# Patient Record
Sex: Female | Born: 1995 | Race: Black or African American | Hispanic: No | Marital: Single | Smoking: Never smoker
Health system: Southern US, Community
[De-identification: ages and names within clinical notes are randomized; demographics above are authoritative.]

## PROBLEM LIST (undated history)

## (undated) DIAGNOSIS — J45909 Unspecified asthma, uncomplicated: Secondary | ICD-10-CM

---

## 2014-05-29 ENCOUNTER — Encounter (HOSPITAL_COMMUNITY): Payer: Self-pay | Admitting: Emergency Medicine

## 2014-05-29 ENCOUNTER — Emergency Department (HOSPITAL_COMMUNITY): Payer: BLUE CROSS/BLUE SHIELD

## 2014-05-29 ENCOUNTER — Emergency Department (HOSPITAL_COMMUNITY)
Admission: EM | Admit: 2014-05-29 | Discharge: 2014-05-29 | Disposition: A | Discharge status: Home / Self Care | Payer: BLUE CROSS/BLUE SHIELD | Attending: Emergency Medicine | Admitting: Emergency Medicine

## 2014-05-29 DIAGNOSIS — W1839XA Other fall on same level, initial encounter: Secondary | ICD-10-CM | POA: Diagnosis not present

## 2014-05-29 DIAGNOSIS — S8392XA Sprain of unspecified site of left knee, initial encounter: Secondary | ICD-10-CM | POA: Insufficient documentation

## 2014-05-29 DIAGNOSIS — Y9367 Activity, basketball: Secondary | ICD-10-CM | POA: Diagnosis not present

## 2014-05-29 DIAGNOSIS — Y92310 Basketball court as the place of occurrence of the external cause: Secondary | ICD-10-CM | POA: Insufficient documentation

## 2014-05-29 DIAGNOSIS — R52 Pain, unspecified: Secondary | ICD-10-CM

## 2014-05-29 DIAGNOSIS — Y998 Other external cause status: Secondary | ICD-10-CM | POA: Insufficient documentation

## 2014-05-29 DIAGNOSIS — J45909 Unspecified asthma, uncomplicated: Secondary | ICD-10-CM | POA: Diagnosis not present

## 2014-05-29 DIAGNOSIS — S63502A Unspecified sprain of left wrist, initial encounter: Secondary | ICD-10-CM | POA: Insufficient documentation

## 2014-05-29 DIAGNOSIS — S6992XA Unspecified injury of left wrist, hand and finger(s), initial encounter: Secondary | ICD-10-CM | POA: Diagnosis present

## 2014-05-29 HISTORY — DX: Unspecified asthma, uncomplicated: J45.909

## 2014-05-29 MED ORDER — HYDROCODONE-ACETAMINOPHEN 5-325 MG PO TABS
1.0000 | ORAL_TABLET | Freq: Once | ORAL | Status: AC
  Administered 2014-05-29: 1 via ORAL
  Filled 2014-05-29: qty 1

## 2014-05-29 MED ORDER — HYDROCODONE-ACETAMINOPHEN 5-325 MG PO TABS: ORAL_TABLET | ORAL | Status: AC

## 2014-05-29 NOTE — ED Notes (Signed)
Pt complaint of left wrist and left knee pain post fall playing basketball. No bruising or deformity noted. Pt ambulatory to room from triage.

## 2014-05-29 NOTE — ED Notes (Signed)
Ortho technician at bedside. 

## 2014-05-29 NOTE — ED Notes (Signed)
Mother at bedside. Pisciotta aware and reports will be at bedside shortly.

## 2014-05-29 NOTE — ED Notes (Signed)
Pt c/o left knee and left wrist pain onset last night, after getting knocked to the ground during basketball, pt states her left knee twisted and she felt a "pop," pt caught self with left wrist on ground.

## 2014-05-29 NOTE — ED Notes (Signed)
Pt transported to DG.  

## 2014-05-29 NOTE — Discharge Instructions (Signed)
You were diagnosed with a right wrist sprain, a fracture of the scaphoid bone cannot be ruled out at this time. For this reason, please wear your splint at all times,  cover with a plastic bag when you bathe. You will need a repeat x-ray in 7-10 days to be sure this bone is not broken. Please follow with your primary doctor, or the orthopedist you have been referred to, or, you may return to the emergency room for the repeat x-ray.   Take 800mg  of ibuprofen (that is usually 4 over the counter pills)  3 times a day for pain control. Take with food. Take vicodin for breakthrough pain, do not drink alcohol, drive, care for children or do other critical tasks while taking vicodin.  Please follow with your primary care doctor in the next 2 days for a check-up. They must obtain records for further management.   Do not hesitate to return to the Emergency Department for any new, worsening or concerning symptoms.

## 2014-05-29 NOTE — ED Provider Notes (Signed)
CSN: [id number]     Arrival date & time 05/29/14  0756 History   First MD Initiated Contact with Patient 05/29/14 0802     Chief Complaint  Patient presents with  . Knee Pain  . Wrist Pain     (Consider location/radiation/quality/duration/timing/severity/associated sxs/prior Treatment) HPI  Lisa Roberson is a 19 y.o. female complaining of 8 out of 10 left wrist and left knee pain. Patient states that she was playing basketball last night, she was filed and she fell down. She can't explain exactly how she fell. She is taking no pain medication prior to arrival, she did apply ice. She has been ambulatory. She is left-hand dominant. She denies numbness, weakness, states that the pain is made worse by weightbearing and palpation. Patient states that she felt a pop in the knee.  Past Medical History  Diagnosis Date  . Asthma    History reviewed. No pertinent past surgical history. No family history on file. History  Substance Use Topics  . Smoking status: Never Smoker   . Smokeless tobacco: Not on file  . Alcohol Use: No   OB History    No data available     Review of Systems  10 systems reviewed and found to be negative, except as noted in the HPI.   Allergies  Review of patient's allergies indicates no known allergies.  Home Medications   Prior to Admission medications   Medication Sig Start Date End Date Taking? Authorizing Provider  albuterol (PROVENTIL HFA;VENTOLIN HFA) 108 (90 BASE) MCG/ACT inhaler Inhale 2 puffs into the lungs every 4 (four) hours as needed. Shortness of breath and wheezing 10/05/12  Yes Historical Provider, MD  HYDROcodone-acetaminophen (NORCO/VICODIN) 5-325 MG per tablet Take 1-2 tablets by mouth every 6 hours as needed for pain and/or cough. 05/29/14   Neftaly Swiss, PA-C   BP 94/57 mmHg  Pulse 54  Temp(Src) 98.3 F (36.8 C) (Oral)  Resp 16  SpO2 98%  LMP 05/17/2014 Physical Exam  Constitutional: She is oriented to person, place, and  time. She appears well-developed and well-nourished. No distress.  HENT:  Head: Normocephalic.  Eyes: Conjunctivae and EOM are normal.  Cardiovascular: Normal rate.   Pulmonary/Chest: Effort normal. No stridor.  Musculoskeletal: Normal range of motion. She exhibits tenderness. She exhibits no edema.  Left wrist with no overlying skin changes, no deformity, radial pulses 2+, full range of motion to fingers. She is neurovascularly intact. Patient has mild swelling and tenderness to palpation along the snuff box. No tenderness palpation along the elbow, full range of motion.  Left knee:  No deformity, erythema or abrasions. FROM. No effusion or crepitance. Anterior and posterior drawer show no abnormal laxity. Stable to valgus and varus stress. Joint lines are non-tender. Neurovascularly intact. Pt ambulates with slightly antalgic gait.    Neurological: She is alert and oriented to person, place, and time.  Psychiatric: She has a normal mood and affect.  Nursing note and vitals reviewed.   ED Course  Procedures (including critical care time) Labs Review Labs Reviewed - No data to display  Imaging Review Dg Wrist Complete Left  05/29/2014   CLINICAL DATA:  Wrist pain.  Initial encounter.  Basketball injury.  EXAM: LEFT WRIST - COMPLETE 3+ VIEW  COMPARISON:  None.  FINDINGS: There is no evidence of fracture or dislocation. There is no evidence of arthropathy or other focal bone abnormality. Soft tissues are unremarkable.  IMPRESSION: Negative.   Electronically Signed   By: Charolette ChildGeoffrey  Lamke M.D.  On: 05/29/2014 08:46   Dg Knee Complete 4 Views Left  05/29/2014   CLINICAL DATA:  19 year old female who fell during basketball, with twisting injury. Pain. Initial encounter.  EXAM: LEFT KNEE - COMPLETE 4+ VIEW  COMPARISON:  None.  FINDINGS: Borderline to mild medial compartment joint space loss. No definite joint effusion. Bone mineralization is within normal limits. Patella intact. No acute osseous  abnormality identified.  IMPRESSION: No acute osseous abnormality identified at the left knee.   Electronically Signed   By: Odessa Fleming M.D.   On: 05/29/2014 08:46     EKG Interpretation None      MDM   Final diagnoses:  Pain  Left wrist sprain, initial encounter  Left knee sprain, initial encounter    Filed Vitals:   05/29/14 0804 05/29/14 0813 05/29/14 0904  BP: 124/75  94/57  Pulse: 70  54  Temp:  98.3 F (36.8 C)   TempSrc:  Oral   Resp: 16  16  SpO2: 100%  98%    Medications  HYDROcodone-acetaminophen (NORCO/VICODIN) 5-325 MG per tablet 1 tablet (1 tablet Oral Given 05/29/14 0813)    Lisa Roberson is a pleasant 19 y.o. female presenting with knee and elbow pain after falling while playing basketball last night. Patient is neurovascularly intact. No gross ligamentous injury to the knee. Left wrist which is her dominant wrist, snuffbox tenderness. Will immobilize, obtain plain films. Patient will be given Vicodin, she states that her mother is coming to the ED. States she will not drive home.   X-rays negative, advised patient she will need a repeat wrist x-ray in 7-10 days. I-STAT follow with either Orthos or PCP.   Evaluation does not show pathology that would require ongoing emergent intervention or inpatient treatment. Pt is hemodynamically stable and mentating appropriately. Discussed findings and plan with patient/guardian, who agrees with care plan. All questions answered. Return precautions discussed and outpatient follow up given.   New Prescriptions   HYDROCODONE-ACETAMINOPHEN (NORCO/VICODIN) 5-325 MG PER TABLET    Take 1-2 tablets by mouth every 6 hours as needed for pain and/or cough.         Wynetta Emery, PA-C 05/29/14 0935  I discussed this case with the patient's mother who requests that an MRI be performed with an orthopedic doctor come to the ED to evaluate her colon have explained that as there is no emergency requiring immediate  intervention I cannot do that. All questions encouraged and answered to the best of my ability, orthopedic outpatient care given.  Wynetta Emery, PA-C 05/29/14 1018  Bethann Berkshire, MD 05/29/14 1530

## 2014-05-29 NOTE — ED Notes (Signed)
Pt request to hold discharge until mother arrives in 15 minutes. Per pt mother request to speak with attending prior to discharge. Pisciotta aware and states will be at bedside with mother arrival.

## 2014-05-29 NOTE — ED Notes (Signed)
Pisciotta at bedside. 

## 2014-06-03 ENCOUNTER — Other Ambulatory Visit: Payer: Self-pay | Admitting: Orthopaedic Surgery

## 2014-06-03 DIAGNOSIS — M25532 Pain in left wrist: Secondary | ICD-10-CM

## 2014-06-04 ENCOUNTER — Ambulatory Visit
Admission: RE | Admit: 2014-06-04 | Discharge: 2014-06-04 | Disposition: A | Discharge status: Home / Self Care | Payer: BLUE CROSS/BLUE SHIELD | Source: Ambulatory Visit | Attending: Orthopaedic Surgery | Admitting: Orthopaedic Surgery

## 2014-06-04 DIAGNOSIS — M25532 Pain in left wrist: Secondary | ICD-10-CM

## 2014-06-12 ENCOUNTER — Other Ambulatory Visit: Payer: Self-pay | Admitting: Orthopaedic Surgery

## 2014-06-12 DIAGNOSIS — M25562 Pain in left knee: Secondary | ICD-10-CM

## 2014-06-13 ENCOUNTER — Ambulatory Visit
Admission: RE | Admit: 2014-06-13 | Discharge: 2014-06-13 | Disposition: A | Discharge status: Home / Self Care | Payer: BLUE CROSS/BLUE SHIELD | Source: Ambulatory Visit | Attending: Orthopaedic Surgery | Admitting: Orthopaedic Surgery

## 2014-06-13 DIAGNOSIS — M25562 Pain in left knee: Secondary | ICD-10-CM

## 2014-07-04 ENCOUNTER — Ambulatory Visit: Payer: BLUE CROSS/BLUE SHIELD | Attending: Orthopedic Surgery | Admitting: Physical Therapy

## 2014-07-04 DIAGNOSIS — M25662 Stiffness of left knee, not elsewhere classified: Secondary | ICD-10-CM | POA: Diagnosis not present

## 2014-07-04 DIAGNOSIS — S83512D Sprain of anterior cruciate ligament of left knee, subsequent encounter: Secondary | ICD-10-CM | POA: Insufficient documentation

## 2014-07-04 DIAGNOSIS — X58XXXD Exposure to other specified factors, subsequent encounter: Secondary | ICD-10-CM | POA: Diagnosis not present

## 2014-07-04 DIAGNOSIS — M6281 Muscle weakness (generalized): Secondary | ICD-10-CM | POA: Insufficient documentation

## 2014-07-04 DIAGNOSIS — S83512A Sprain of anterior cruciate ligament of left knee, initial encounter: Secondary | ICD-10-CM

## 2014-07-04 NOTE — Patient Instructions (Signed)
   Hamstring Stretch   With other leg bent, foot flat, grasp right leg and slowly try to straighten knee. Hold __20__ seconds. Repeat ___3_ times. Do _2___ sessions per day.  http://gt2.exer.us/279   Hamstring Stretch (Standing)   Standing, place one heel on chair or bench. Use one or both hands on thigh for support. Keeping torso straight, lean forward slowly until a stretch is felt in back of same thigh. Hold ____ seconds. Repeat with other leg.  Hamstring Step 2   Left foot relaxed, knee straight, other leg bent, foot flat. Raise straight leg further upward to maximal range. Hold ___ seconds. Relax leg completely down. Repeat ___ times.  Hamstring Step 3   Left leg in maximal straight leg raise, heel at maximal stretch, straighten knee further by tightening knee cap. Warning: Intense stretch. Stay within tolerance. Hold ___ seconds. Relax knee cap only. Repeat ___ times.  Hamstring Step 4   Left leg and foot in maximal stretch. Slowly lengthen and press other leg down as close to floor as possible. Keep lower abdominals tight. Warning: Intense stretch. Stay within tolerance. Hold ___20 seconds. Relax lengthened leg slightly. Do not re-bend knee. Repeat press and lengthen _3__ times.   Stretching: Hamstring (Sitting)   With right leg straight, tuck other foot near groin. Reach down until stretch is felt in back of thigh. Keep back straight. Hold 20____ seconds. Repeat __3__ times per set. Do ____ sets per session. Do __3__ sessions per day.  http://orth.exer.us/661   Copyright  VHI. All rights reserved.    Copyright  VHI. All rights reserved.  HIP: Flexion / KNEE: Extension, Straight Leg Raise   Raise leg, keeping knee straight. Perform slowly. _20__ reps per set, _3__ sets per day, 7___ days per week   Copyright  VHI. All rights reserved.  Heel Slide   Bend knee and pull heel toward buttocks. Hold ___5_ seconds. Return. Repeat with other knee. Repeat  __10__ times. Do _6___ sessions per day.  http://gt2.exer.us/372   Copyright  VHI. All rights reserved.  Quad Set   Slowly tighten muscles on thigh of straight leg while counting out loud to _5___. Repeat with other leg. Repeat _10___ times. Do __6__ sessions per day.  http://gt2.exer.us/361   Copyright  VHI. All rights reserved.

## 2014-07-04 NOTE — Therapy (Signed)
[facility] [address] [place], [place], [address] Phone: [phone]   Fax:  [phone]  Physical Therapy Evaluation  Patient Details  Name: Lisa CustardCardesheia Roberson MRN: [id number] Date of Birth: 10-10-1995 Referring Provider:  Jetty PeeksPetrarca, Brian D, PA-C  Encounter Date: 07/04/2014      PT End of Session - 07/04/14 1322    Visit Number 1   Number of Visits 16   Date for PT Re-Evaluation 08/29/14   Authorization Type BCBS   PT Start Time 0800   PT Stop Time 0843   PT Time Calculation (min) 43 min   Activity Tolerance Patient tolerated treatment well      Past Medical History  Diagnosis Date  . Asthma     No past surgical history on file.  There were no vitals filed for this visit.  Visit Diagnosis:  ACL tear, left, initial encounter - Plan: PT plan of care cert/re-cert  Joint stiffness of knee, left - Plan: PT plan of care cert/re-cert  Muscle weakness of lower extremity - Plan: PT plan of care cert/re-cert      Subjective Assessment - 07/04/14 0804    Subjective Playing basketball and was fouled and fell in early April ( 1 1/2 months ago) resulting in partial ACL tear left and left hairline fracture (wearing a cast).  No previous knee problems.  Patient states the doctor wanted her to strengthen muscles before surgery.     Limitations Walking  running and balance on it   How long can you sit comfortably? no problem except sitting [place] style   How long can you stand comfortably? not much difficulty   How long can you walk comfortably? 1/2 mile   Diagnostic tests x-ray and MRI   Patient Stated Goals be back to normal so I can play basketball   Currently in Pain? Yes   Pain Score 0-No pain   Pain Location Knee   Pain Orientation Left   Pain Type Acute pain   Pain Onset More than a month ago   Pain Frequency Intermittent   Aggravating Factors  balancing on left leg, walking too long   Pain Relieving Factors not  doing anything            [facility] - 07/04/14 0810    Assessment   Medical Diagnosis left partial ACL tear   Onset Date 05/28/14   Next MD Visit 07/05/14   Prior Therapy no   Precautions   Precautions None   Restrictions   Weight Bearing Restrictions No   Other Position/Activity Restrictions --  used crutches 1-2 days    Balance Screen   Has the patient fallen in the past 6 months Yes  1   How many times? 1   Has the patient had a decrease in activity level because of a fear of falling?  No   Is the patient reluctant to leave their home because of a fear of falling?  No   Home Environment   Living Enviornment Private residence   Living Arrangements Non-relatives/Friends   Type of Home Apartment   Home Access Stairs to enter   Entrance Stairs-Number of Steps 3 flights   Entrance Stairs-Rails Left   Home Layout One level   Prior Function   Level of Independence Independent with basic ADLs   Vocation Student  starting part-time job next week sitting?   Leisure play intramural basketball   Observation/Other Assessments   Observations no swelling present at this  early AM appt.   Focus on Therapeutic Outcomes (FOTO)  47%   Functional Tests   Functional tests Step down   Step Down   Comments pelvic drop and genu valgus left > right   ROM / Strength   AROM / PROM / Strength AROM;Strength   AROM   Overall AROM Comments good patellar mobility, no pain   AROM Assessment Site Knee   Right/Left Knee Right;Left   Right Knee Extension 0   Right Knee Flexion 140   Left Knee Extension 8   Left Knee Flexion 132   Strength   Overall Strength Comments Left quad lag with SLR   Strength Assessment Site Knee;Hip   Right/Left Hip Right;Left   Right Hip Flexion 5/5   Right Hip Extension 5/5   Right Hip ABduction 5/5   Left Hip Flexion 5/5   Left Hip Extension 5/5   Left Hip ABduction 4+/5   Right/Left Knee Right;Left   Right Knee Flexion 5/5   Right Knee Extension  5/5   Left Knee Flexion 4/5   Left Knee Extension 4/5   Flexibility   Soft Tissue Assessment /Muscle Length --  Bilateral HS length 80 degrees   Special Tests    Special Tests Knee Special Tests   Knee Special tests  other   other    Findings --  single leg stand with mild compensation/dec balance   Ambulation/Gait   Gait Comments No limp with walking at normal gait speed on level surface, no assistive device                   [facility] Adult PT Treatment/Exercise - 07/04/14 0810    Knee/Hip Exercises: Aerobic   Stationary Bike 8 min full revolutions                PT Education - 07/04/14 1321    Education provided Yes   Education Details Initial HEP:  HS stretch, SLR, quad sets, heel slides   Person(s) Educated Patient   Methods Explanation;Demonstration;Handout   Comprehension Verbalized understanding;Returned demonstration          PT Short Term Goals - 07/04/14 1326    PT SHORT TERM GOAL #1   Title Patient will express a good understanding of basic exercises to increase ROM, home methods for swelling and pain control   Time 4   Period Weeks   Status New   PT SHORT TERM GOAL #2   Title Left knee AROM 4-135 degrees needed for greater ease on the stairs to enter/exit apartment.   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  She is referred to PT for strengthening of her left knee.  She presents ambulating without an assisitive device, no bracing.  She reports decreasing pain which when present is in the posterior aspect of her left knee.   She reports difficulty walking longer distances, difficulty going up and down 3 flights of stairs to get to her apartment and the inability to play basketball.  Her AROM is limited:  8-132 degrees (0-140 on right).  +Quad lag with SLR.  Decreased left quad, HS and gluteus medius strength 4/5.  Compensatory pelvic drop and genu valgus with single leg standing and step down tests.  Patient would benefit from PT to address these deficits.     Pt will benefit from skilled therapeutic intervention in order to improve on the following deficits Decreased range of motion;Decreased strength;Pain   Rehab Potential Good   Clinical Impairments Affecting Rehab Potential Partial left ACL tear   PT Frequency 2x / week   PT Duration 8 weeks   PT Treatment/Interventions Cryotherapy;Electrical Stimulation;Moist Heat;Ultrasound;Therapeutic exercise;Neuromuscular re-education;Patient/family education;Manual techniques   PT Next Visit Plan Bike; glut med strengthening; low to moderate level closed chain strengthening;  cold pack if needed         Problem List There are no active problems to display for this patient.   Vivien Presto 07/04/2014, 4:30 PM  [facility] [address] [place], [place], [address] Phone: [phone]   Fax:  [phone]   Lavinia Sharps, PT 07/04/2014 4:31 PM Phone: [phone] Fax: [phone]

## 2014-07-09 ENCOUNTER — Ambulatory Visit: Payer: BLUE CROSS/BLUE SHIELD | Admitting: Physical Therapy

## 2014-07-09 DIAGNOSIS — S83512A Sprain of anterior cruciate ligament of left knee, initial encounter: Secondary | ICD-10-CM

## 2014-07-09 DIAGNOSIS — M6281 Muscle weakness (generalized): Secondary | ICD-10-CM

## 2014-07-09 DIAGNOSIS — S83512D Sprain of anterior cruciate ligament of left knee, subsequent encounter: Secondary | ICD-10-CM | POA: Diagnosis not present

## 2014-07-09 DIAGNOSIS — M25662 Stiffness of left knee, not elsewhere classified: Secondary | ICD-10-CM

## 2014-07-09 NOTE — Therapy (Signed)
[place], [place], [address] Phone: [phone]   Fax:  [phone]  Physical Therapy Treatment  Patient Details  Name: Lisa Roberson MRN: [id number] Date of Birth: [birth year] Referring Provider:  Cherylann Ratel, PA-C  Encounter Date: 07/09/2014      PT End of Session - 07/09/14 0815    Visit Number 2   Number of Visits 16   Date for PT Re-Evaluation 08/29/14   Authorization Type BCBS   PT Start Time 0800   PT Stop Time 0848   PT Time Calculation (min) 48 min   Activity Tolerance Patient tolerated treatment well      Past Medical History  Diagnosis Date  . Asthma     No past surgical history on file.  There were no vitals filed for this visit.  Visit Diagnosis:  ACL tear, left, initial encounter  Joint stiffness of knee, left  Muscle weakness of lower extremity      Subjective Assessment - 07/09/14 0801    Subjective Reports minor soreness after last visit but nothing too bad.  Denies pain today.  Got cast off her left forearm.     Currently in Pain? No/denies   Aggravating Factors  walking too long; balancing on left leg   Pain Relieving Factors not doing anything                         [facility] Adult PT Treatment/Exercise - 07/09/14 0803    Knee/Hip Exercises: Stretches   Passive Hamstring Stretch 3 reps;20 seconds   Quad Stretch 3 reps;20 seconds   Knee/Hip Exercises: Aerobic   Stationary Bike 8 min full revolutions   Knee/Hip Exercises: Standing   Heel Raises 20 reps   Knee Flexion Left;AROM;10 reps   Lateral Step Up Left;1 set;10 reps;Step Height: 4";Hand Hold: 0   Forward Step Up Left;1 set;10 reps;Step Height: 4";Hand Hold: 0   Step Down Left;10 reps;Step Height: 4";Hand Hold: 1   SLS with Vectors 10x WB 4 ways on left   Other Standing Knee Exercises mini squat 15x   Knee/Hip Exercises: Supine   Quad Sets Strengthening;Both;1 set;10 reps   Heel  Slides AROM;1 set;10 reps;Left   Hip Adduction Isometric Strengthening;Both;1 set;10 reps   Straight Leg Raises Strengthening;Left;1 set   Knee/Hip Exercises: Sidelying   Hip ABduction AROM;Left;1 set;10 reps   Hip ADduction AROM;Left;1 set;10 reps   Clams 10x left   Knee/Hip Exercises: Machines for Strengthening   Cybex Knee Flexion 30# B and 30# with left only 15x each   Total Gym Leg Press B 40# 15x; Left only 20# 15x                  PT Short Term Goals - 07/09/14 0840    PT SHORT TERM GOAL #1   Title Patient will express a good understanding of basic exercises to increase ROM, home methods for swelling and pain control   Time 4   Period Weeks   Status Partially Met   PT SHORT TERM GOAL #2   Title Left knee AROM 4-135 degrees needed for greater ease on the stairs to enter/exit apartment.   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     Time 8   Period Weeks   Status On-going   PT LONG TERM GOAL #4   Title FOTO functional outcome score improved 47% to 26% indicating improved function with less pain   Time 8   Period Weeks   Status On-going               Plan - 07/09/14 4514    Clinical Impression Statement Patient with improved quad set and decreased lag sign with SLR.  Therapist cueing for patellofemoral alignment.  No complaints of pain.  Quad muscle atrophy apparent on left.   Visible muscle quivering with single activities.     Clinical Impairments Affecting Rehab Potential Partial left ACL tear   PT Next Visit Plan Progress to intermediate exercises with focus on closed chain quad and gluteal strengthening;  remeasure left knee AROM        Problem List There are no active problems to display for this patient.   [name] 07/09/2014, 8:42 AM  [facility] [address] [place], [place], [address] Phone: [phone]   Fax:  [phone]   Ruben Im, PT 07/09/2014 8:42 AM Phone: [phone] Fax: [phone]

## 2014-07-12 ENCOUNTER — Ambulatory Visit: Payer: BLUE CROSS/BLUE SHIELD | Admitting: Physical Therapy

## 2014-07-12 DIAGNOSIS — S83512A Sprain of anterior cruciate ligament of left knee, initial encounter: Secondary | ICD-10-CM

## 2014-07-12 DIAGNOSIS — M6281 Muscle weakness (generalized): Secondary | ICD-10-CM

## 2014-07-12 DIAGNOSIS — M25662 Stiffness of left knee, not elsewhere classified: Secondary | ICD-10-CM

## 2014-07-12 DIAGNOSIS — S83512D Sprain of anterior cruciate ligament of left knee, subsequent encounter: Secondary | ICD-10-CM | POA: Diagnosis not present

## 2014-07-12 NOTE — Therapy (Signed)
[facility] Outpatient Rehabilitation [facility] [address] [place], [place], [address] Phone: [phone]   Fax:  [phone]  Physical Therapy Treatment  Patient Details  Name: Lisa Roberson MRN: [id number] Date of Birth: 07/28/1995 Referring Provider:  Jetty Peeks, PA-C  Encounter Date: 07/12/2014      PT End of Session - 07/12/14 0727    Visit Number 3   Number of Visits 16   Date for PT Re-Evaluation 08/29/14   Authorization Type BCBS   PT Start Time 0657   PT Stop Time 0738   PT Time Calculation (min) 41 min   Activity Tolerance Patient tolerated treatment well      Past Medical History  Diagnosis Date  . Asthma     No past surgical history on file.  There were no vitals filed for this visit.  Visit Diagnosis:  ACL tear, left, initial encounter  Joint stiffness of knee, left  Muscle weakness of lower extremity      Subjective Assessment - 07/12/14 0658    Subjective Reports some soreness that next day after last session but nothing too bad.  Ambulating on level surface without limp.  Wearing left wrist brace.  Going home to [place] for cousin's graduation.    Currently in Pain? No/denies   Pain Orientation Left   Pain Type Acute pain   Pain Onset More than a month ago   Pain Frequency Intermittent   Aggravating Factors  prolonged walking; standing a lot   Pain Relieving Factors not doing anything            [facility] PT Assessment - 07/12/14 0704    ROM / Strength   AROM / PROM / Strength AROM   AROM   Left Knee Extension 0   Left Knee Flexion 138                     [facility] Adult PT Treatment/Exercise - 07/12/14 0702    Knee/Hip Exercises: Aerobic   Stationary Bike 5 min   Elliptical 5 min   Knee/Hip Exercises: Machines for Strengthening   Total Gym Leg Press B 40# 15x; Left only 20# 15x2   Knee/Hip Exercises: Standing   Lateral Step Up 1 set;Left;15 reps;Hand Hold: 0;Step Height: 6"   Forward Step Up  Left;1 set;15 reps;Hand Hold: 0;Step Height: 6"   Wall Squat 1 set;15 reps  with physioball   SLS on left with right UE green band diagonal extensions 2x 15   SLS with Vectors WB on left right green band 15x 4 ways   Knee/Hip Exercises: Supine   Bridges Right;Left;Both;2 sets;15 reps   Other Supine Knee Exercises HS sets on ball 15x; HS sets with curls on ball 15x   Knee/Hip Exercises: Sidelying   Other Sidelying Knee Exercises side planks 2x 30 sec right and left   Knee/Hip Exercises: Prone   Other Prone Exercises planks 1 x 30 secs                  PT Short Term Goals - 07/12/14 6962    PT SHORT TERM GOAL #1   Title Patient will express a good understanding of basic exercises to increase ROM, home methods for swelling and pain control   Status Achieved   PT SHORT TERM GOAL #2   Title Left knee AROM 4-135 degrees needed for greater ease on the stairs to enter/exit apartment.   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     Time 8   Period Weeks   Status On-going   PT LONG TERM GOAL #4   Title FOTO functional outcome score improved 47% to 26% indicating improved function with less pain   Time 8   Period Weeks   Status On-going               Plan - 07/12/14 16100728    Clinical Impression Statement Good improvement in knee AROM.  Improving proprioception.  Min verbal cues to avoid knee hyperextension and for patellofemoral alignment.   Tightness reported but no pain following treatment session.  Progressing with goals.    PT Next Visit Plan Progress to intermediate exercises with focus on closed chain quad, core and gluteal strengthening   Recommended Other Services MD follow up 6/24        Problem List There are no active problems to display for this patient.   Vivien PrestoSimpson, Stacy C 07/12/2014, 7:42 AM  [facility] [address] [place], [place], [address] Phone: [phone]   Fax:  [phone]    Lavinia SharpsStacy Simpson, PT 07/12/2014 7:43 AM Phone: [phone] Fax: [phone]

## 2014-07-23 ENCOUNTER — Ambulatory Visit: Payer: BLUE CROSS/BLUE SHIELD | Attending: Orthopedic Surgery | Admitting: Physical Therapy

## 2014-07-23 DIAGNOSIS — M25662 Stiffness of left knee, not elsewhere classified: Secondary | ICD-10-CM | POA: Diagnosis present

## 2014-07-23 DIAGNOSIS — X58XXXA Exposure to other specified factors, initial encounter: Secondary | ICD-10-CM | POA: Insufficient documentation

## 2014-07-23 DIAGNOSIS — M6281 Muscle weakness (generalized): Secondary | ICD-10-CM

## 2014-07-23 DIAGNOSIS — S83512A Sprain of anterior cruciate ligament of left knee, initial encounter: Secondary | ICD-10-CM | POA: Insufficient documentation

## 2014-07-23 NOTE — Therapy (Signed)
[facility] Outpatient Rehabilitation [facility] [address] [place], [place], [address] Phone: [phone]   Fax:  [phone]  Physical Therapy Treatment  Patient Details  Name: Lisa Roberson MRN: [id number] Date of Birth: 1995/03/11 Referring Provider:  Jetty Peeks, PA-C  Encounter Date: 07/23/2014      PT End of Session - 07/23/14 0837    Visit Number 4   Number of Visits 16   Date for PT Re-Evaluation 08/29/14   Authorization Type BCBS   PT Start Time 0800   PT Stop Time 0845   PT Time Calculation (min) 45 min   Activity Tolerance Patient tolerated treatment well      Past Medical History  Diagnosis Date  . Asthma     No past surgical history on file.  There were no vitals filed for this visit.  Visit Diagnosis:  ACL tear, left, initial encounter  Joint stiffness of knee, left  Muscle weakness of lower extremity      Subjective Assessment - 07/23/14 0804    Subjective It's been sore.  States she is going to the gym and doing the bike and leg press.  States she feels her knee is getting stronger.  When she does a full knee bend with self overpressure it hurts.     Currently in Pain? No/denies  states "just sore."   Pain Score 0-No pain   Pain Location Knee   Pain Orientation Left   Pain Onset More than a month ago   Pain Frequency Intermittent   Aggravating Factors  bend knee with overpressure   Pain Relieving Factors ice                         [facility] Adult PT Treatment/Exercise - 07/23/14 0807    Knee/Hip Exercises: Aerobic   Stationary Bike 7 min   Elliptical 5 min   Isokinetic Hip machine 25# 2x 15ext, 2x 15abd left only   Knee/Hip Exercises: Machines for Strengthening   Cybex Knee Flexion  30# with left only 15x each 30x   Total Gym Leg Press  Left only 25# 15x2   Knee/Hip Exercises: Standing   Heel Raises 20 reps  eccentric lowers   Lateral Step Up 1 set;Left;15 reps;Hand Hold: 0;Step Height: 8"   Forward Step Up Left;1 set;15 reps;Hand Hold: 0;Step Height: 8"   Wall Squat 1 set;20 reps  ball behind back   SLS with Vectors WB on left right green band 15x 4 ways  standing on blue foam light UE assist   Other Standing Knee Exercises SLS left with alternating finger touch 6 in from floor 20x   Modalities   Modalities Cryotherapy   Cryotherapy   Number Minutes Cryotherapy 10 Minutes   Cryotherapy Location Knee   Type of Cryotherapy Ice pack                  PT Short Term Goals - 07/23/14 2841    PT SHORT TERM GOAL #1   Title Patient will express a good understanding of basic exercises to increase ROM, home methods for swelling and pain control   Status Achieved   PT SHORT TERM GOAL #2   Title Left knee AROM 4-135 degrees needed for greater ease on the stairs to enter/exit apartment.   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     Time 8   Period Weeks   Status On-going   PT LONG TERM GOAL #4   Title FOTO functional outcome score improved 47% to 26% indicating improved function with less pain   Time 8   Period Weeks   Status On-going               Plan - 07/23/14 16100838    Clinical Impression Statement Patient is progressing with with ROM, strength and pain reduction.  Therapist monitoring for patellofemoral alignment.  MD follow up 6/24.  Continue 3-4 more visits for further strengthening.   PT Next Visit Plan Progress to  intermediate exercises with focus on closed chain quad, core and gluteal strengthening        Problem List There are no active problems to display for this patient.   Vivien PrestoSimpson, Teren Franckowiak C 07/23/2014, 8:45 AM  [facility] [address] [place], [place], [address] Phone: [phone]   Fax:  [phone]  Lavinia SharpsStacy [place]on Yearby, PT 07/23/2014 8:45 AM Phone: [phone] Fax: [phone]

## 2014-07-25 ENCOUNTER — Ambulatory Visit: Payer: BLUE CROSS/BLUE SHIELD | Admitting: Physical Therapy

## 2014-07-25 DIAGNOSIS — M6281 Muscle weakness (generalized): Secondary | ICD-10-CM

## 2014-07-25 DIAGNOSIS — S83512A Sprain of anterior cruciate ligament of left knee, initial encounter: Secondary | ICD-10-CM

## 2014-07-25 DIAGNOSIS — M25662 Stiffness of left knee, not elsewhere classified: Secondary | ICD-10-CM

## 2014-07-25 NOTE — Therapy (Signed)
[facility] Outpatient Rehabilitation [facility] [address] [place], [place], [address] Phone: [phone]   Fax:  [phone]  Physical Therapy Treatment  Patient Details  Name: Lisa Roberson MRN: [id number] Date of Birth: 1995/11/22 Referring Provider:  Jetty Peeks, PA-C  Encounter Date: 07/25/2014      PT End of Session - 07/25/14 0823    Visit Number 5   Number of Visits 16   Date for PT Re-Evaluation 08/29/14   Authorization Type BCBS   PT Start Time 0757   PT Stop Time 0850   PT Time Calculation (min) 53 min   Activity Tolerance Patient tolerated treatment well      Past Medical History  Diagnosis Date  . Asthma     No past surgical history on file.  There were no vitals filed for this visit.  Visit Diagnosis:  ACL tear, left, initial encounter  Joint stiffness of knee, left  Muscle weakness of lower extremity      Subjective Assessment - 07/25/14 0800    Subjective Soreness on the back of the knee after last session.  Had a lazy day yesterday.  Some soreness this morning.   Currently in Pain? Yes   Pain Score 5    Pain Location Knee   Pain Orientation Left   Pain Descriptors / Indicators Sore   Pain Type Acute pain   Pain Onset More than a month ago   Pain Frequency Intermittent   Aggravating Factors  prolonged walking causes soreness   Pain Relieving Factors ice                         [facility] Adult PT Treatment/Exercise - 07/25/14 0803    Knee/Hip Exercises: Aerobic   Stationary Bike 8 min   Elliptical 6 min   Knee/Hip Exercises: Machines for Strengthening   Total Gym Leg Press Left only 40# 15x 2 Seat 8 (90 degrees)   Knee/Hip Exercises: Standing   Lateral Step Up 1 set;Left;15 reps;Hand Hold: 0;Step Height: 8"   Forward Step Up Left;1 set;15 reps;Hand Hold: 0;Step Height: 8"   Step Down Left;1 set;15 reps;Hand Hold: 0;Step Height: 6"   SLS on left with plyo ball toss on/off blue foam   Walking  with Sports Cord resisted FW and BW walk with D plate weight 6x each   Other Standing Knee Exercises rocker board for gastroc stretch 3x 20 sec   Other Standing Knee Exercises Lateral step downs 3 points 16x 6 in   Knee/Hip Exercises: Sidelying   Hip ABduction AROM;Left;1 set;15 reps   Knee/Hip Exercises: Prone   Other Prone Exercises planks 15sec hold 3x   Cryotherapy   Number Minutes Cryotherapy 10 Minutes   Cryotherapy Location Knee   Type of Cryotherapy Ice pack                  PT Short Term Goals - 07/25/14 0830    PT SHORT TERM GOAL #1   Title Patient will express a good understanding of basic exercises to increase ROM, home methods for swelling and pain control   Status Achieved   PT SHORT TERM GOAL #2   Title Left knee AROM 4-135 degrees needed for greater ease on the stairs to enter/exit apartment.   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     Time 8   Period Weeks   Status On-going   PT LONG TERM GOAL #4   Title FOTO functional outcome score improved 47% to 26% indicating improved function with less pain   Time 8   Period Weeks   Status On-going               Plan - 07/25/14 1610    Clinical Impression Statement Good patellofemoral alignment noted even with more advanced eccentric lowering.  Muscle fatigue noted with muscle quivering.  No pain during exercise session although post ex soreness for 24 hours. Progressing  toward LTGs.  Continue 2 more visits, then follow up with MD.       PT Next Visit Plan Progress with intermediate and advanced level exercises with focus on closed chain quad, core and gluteal strengthening        Problem List There are no active problems to display for this patient.   Vivien Presto 07/25/2014, 8:37 AM  [facility] [address] [place], [place], [address] Phone: [phone]   Fax:  [phone]   Lavinia Sharps, PT 07/25/2014 8:37 AM Phone: [phone] Fax: [phone]

## 2014-07-30 ENCOUNTER — Ambulatory Visit: Payer: BLUE CROSS/BLUE SHIELD | Admitting: Physical Therapy

## 2014-07-30 DIAGNOSIS — S83512A Sprain of anterior cruciate ligament of left knee, initial encounter: Secondary | ICD-10-CM | POA: Diagnosis not present

## 2014-07-30 DIAGNOSIS — M6281 Muscle weakness (generalized): Secondary | ICD-10-CM

## 2014-07-30 DIAGNOSIS — M25662 Stiffness of left knee, not elsewhere classified: Secondary | ICD-10-CM

## 2014-07-30 NOTE — Therapy (Signed)
[facility] Outpatient Rehabilitation [facility] [address] [place], [place], [address] Phone: [phone]   Fax:  [phone]  Physical Therapy Treatment  Patient Details  Name: Lisa Roberson MRN: [id number] Date of Birth: May 28, 1995 Referring Provider:  Jetty Peeks, PA-C  Encounter Date: 07/30/2014      PT End of Session - 07/30/14 0826    Visit Number 6   Number of Visits 16   Date for PT Re-Evaluation 08/29/14   Authorization Type BCBS   PT Start Time 0753   PT Stop Time 0847   PT Time Calculation (min) 54 min   Activity Tolerance Patient tolerated treatment well      Past Medical History  Diagnosis Date  . Asthma     No past surgical history on file.  There were no vitals filed for this visit.  Visit Diagnosis:  ACL tear, left, initial encounter  Joint stiffness of knee, left  Muscle weakness of lower extremity      Subjective Assessment - 07/30/14 0753    Subjective Knee is "feeling better."  Sees the doctor 08/09/14.  Hasn't been to the gym recently.  No feeling of instability recently.  Able to walk longer distances better.     Currently in Pain? No/denies   Pain Location Knee   Pain Orientation Left   Pain Onset More than a month ago   Pain Frequency Intermittent   Aggravating Factors  sit [place] style                         [facility] Adult PT Treatment/Exercise - 07/30/14 0800    Knee/Hip Exercises: Aerobic   Stationary Bike 5 min   Tread Mill light jog 10 min   Knee/Hip Exercises: Plyometrics   Bilateral Jumping 3 sets  wall jumps 10x; on floor over 1 1/2 tiles 2x 10   Other Plyometric Exercises small bounce and jump on mini trampoline 45 sec each   Other Plyometric Exercises 4 inch box jump downs 10x; 4 inch box jump up 10x   Knee/Hip Exercises: Standing   Heel Raises 20 reps  on rocker board/alternating heel raises, minisquats   Forward Step Up Left;20 reps;Hand Hold: 0;Step Height: 8"   Step Down  Left;1 set;15 reps;Hand Hold: 0;Step Height: 6"   SLS SLS left dead lift 10# kettle bell 15x   SLS with Vectors SLS on left on BOSU 15x   Cryotherapy   Number Minutes Cryotherapy 10 Minutes   Cryotherapy Location Knee   Type of Cryotherapy Ice pack                  PT Short Term Goals - 07/30/14 8921    PT SHORT TERM GOAL #1   Title Patient will express a good understanding of basic exercises to increase ROM, home methods for swelling and pain control   Status Achieved   PT SHORT TERM GOAL #2   Title Left knee AROM 4-135 degrees needed for greater ease on the stairs to enter/exit apartment.   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     Time 8   Period Weeks   Status On-going   PT LONG TERM GOAL #4   Title FOTO functional outcome score improved 47% to 26% indicating improved function with less pain   Time 8   Period Weeks   Status On-going               Plan - 07/30/14 0826    Clinical Impression Statement Patient reports very minimal discomfort with beginning plyometrics.  Therapist cuing for emphasis on eccentric control and decreased femoral IR.  Improving quad motor control.     PT Next Visit Plan Continue with limited return to activity phase with lower level plyometrics, proprioception, strengthening and cardio; 2 more visits        Problem List There are no active problems to display for this patient.   Vivien Presto 07/30/2014, 8:38 AM  [facility] [address] [place], [place], [address] Phone: [phone]   Fax:  [phone]   Lavinia Sharps, PT 07/30/2014 8:39 AM Phone: [phone] Fax: [phone]

## 2014-08-01 ENCOUNTER — Ambulatory Visit: Payer: BLUE CROSS/BLUE SHIELD | Admitting: Physical Therapy

## 2014-08-06 ENCOUNTER — Ambulatory Visit: Payer: BLUE CROSS/BLUE SHIELD | Admitting: Physical Therapy

## 2014-08-06 DIAGNOSIS — S83512A Sprain of anterior cruciate ligament of left knee, initial encounter: Secondary | ICD-10-CM | POA: Diagnosis not present

## 2014-08-06 DIAGNOSIS — M6281 Muscle weakness (generalized): Secondary | ICD-10-CM

## 2014-08-06 DIAGNOSIS — M25662 Stiffness of left knee, not elsewhere classified: Secondary | ICD-10-CM

## 2014-08-06 NOTE — Therapy (Signed)
[facility] Outpatient Rehabilitation [facility] [address] [place], [place], [address] Phone: [phone]   Fax:  [phone]  Physical Therapy Treatment  Patient Details  Name: Lisa Roberson MRN: [id number] Date of Birth: 14-May-1995 Referring Provider:  Jetty Peeks, PA-C  Encounter Date: 08/06/2014      PT End of Session - 08/06/14 0822    Visit Number 7   Number of Visits 16   Date for PT Re-Evaluation 08/29/14   Authorization Type BCBS   PT Start Time 0754   PT Stop Time 0850   PT Time Calculation (min) 56 min   Activity Tolerance Patient tolerated treatment well      Past Medical History  Diagnosis Date  . Asthma     No past surgical history on file.  There were no vitals filed for this visit.  Visit Diagnosis:  ACL tear, left, initial encounter  Joint stiffness of knee, left  Muscle weakness of lower extremity      Subjective Assessment - 08/06/14 0751    Subjective Patient states she called to cancel last week's appt because she got a new car.  She states her knee is doing well without buckling.  Reports muscle fatigue after last viist with jumping /plyometric progression but no adverse effects.  MD appt changed to 7/1.     Currently in Pain? No/denies   Pain Score 0-No pain   Pain Location Knee   Pain Orientation Left   Aggravating Factors  sitting [place] style   Pain Relieving Factors ice                         [facility] Adult PT Treatment/Exercise - 08/06/14 0759    Knee/Hip Exercises: Aerobic   Stationary Bike 5 min   Tread Mill light jog 5 min 3.0 mph   Knee/Hip Exercises: Machines for Strengthening   Total Gym Leg Press Left only 50# Seat 8 3x10   Knee/Hip Exercises: Plyometrics   Bilateral Jumping 1 set;15 reps  FW and lateral bilateral jumps   Broad Jump 1 set;15 reps   Other Plyometric Exercises 90 degrees turn complex double limb jumps 12x   Other Plyometric Exercises 4 inch lateral box jumps  15x; 6 inch step  FW jump ups and downs 15x each   Knee/Hip Exercises: Standing   Forward Lunges Both;1 set;15 reps   Side Lunges Both;1 set;15 reps   Lunge Walking - Round Trips BW lunges 15x both legs   SLS SLS left dead lift 15# kettle bell 15x   SLS with Vectors SLS on left on green foam with eyes closed 15x                  PT Short Term Goals - 08/06/14 0831    PT SHORT TERM GOAL #1   Title Patient will express a good understanding of basic exercises to increase ROM, home methods for swelling and pain control   Status Achieved   PT SHORT TERM GOAL #2   Title Left knee AROM 4-135 degrees needed for greater ease on the stairs to enter/exit apartment.   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     Time 8   Period Weeks   Status On-going   PT LONG TERM GOAL #4   Title FOTO functional outcome score improved 47% to 26% indicating improved function with less pain   Time 8   Period Weeks   Status On-going               Plan - 08/06/14 4540    Clinical Impression Statement Patient progressing well with "limited return to activity phase" of rehab with simple double-limb and complex double limb jumps.  Patient notes weakness with lateral jumps and muscle fatigue after 5 min  of light jogging.   Therapist cueing fro soft landings with eccentric control.  Decreased proprioception noted with single limb standing with eyes closed.     PT Next  Visit Plan Do FOTO; Continue with limited return to activity phase with lower level plyometrics, proprioception, strengthening and cardio;  MD 7/1 send note        Problem List There are no active problems to display for this patient.   Vivien Presto 08/06/2014, 8:32 AM  [facility] [address] [place], [place], [address] Phone: [phone]   Fax:  [phone]     Lavinia Sharps, PT 08/06/2014 8:33 AM Phone: [phone] Fax: [phone]

## 2014-08-08 ENCOUNTER — Ambulatory Visit: Payer: BLUE CROSS/BLUE SHIELD | Admitting: Physical Therapy

## 2014-08-08 DIAGNOSIS — M25662 Stiffness of left knee, not elsewhere classified: Secondary | ICD-10-CM

## 2014-08-08 DIAGNOSIS — S83512A Sprain of anterior cruciate ligament of left knee, initial encounter: Secondary | ICD-10-CM

## 2014-08-08 DIAGNOSIS — M6281 Muscle weakness (generalized): Secondary | ICD-10-CM

## 2014-08-08 NOTE — Therapy (Addendum)
[facility] [address] [place], [place], [address] Phone: [phone]   Fax:  [phone]  Physical Therapy Treatment  Patient Details  Name: Lisa Roberson MRN: [id number] Date of Birth: 12/28/1995 Referring Provider:  Petrarca, Brian D, PA-C  Encounter Date: 08/08/2014      PT End of Session - 08/08/14 0844    Visit Number 8   Number of Visits 16   Date for PT Re-Evaluation 08/29/14   Authorization Type [employer]   PT Start Time 0800   PT Stop Time 0844   PT Time Calculation (min) 44 min   Activity Tolerance Patient tolerated treatment well      Past Medical History  Diagnosis Date  . Asthma     No past surgical history on file.  There were no vitals filed for this visit.  Visit Diagnosis:  Muscle weakness of lower extremity  ACL tear, left, initial encounter  Joint stiffness of knee, left      Subjective Assessment - 08/08/14 0801    Subjective Reports just being tired after driving to [place] yesterday.  MD next week.              [facility] PT Assessment - 08/08/14 0802    Observation/Other Assessments   Focus on Therapeutic Outcomes (FOTO)  26%   AROM   Left Knee Extension 0   Left Knee Flexion 138   Strength   Left Hip ABduction 5/5   Left Knee Flexion 4+/5   Left Knee Extension 4+/5                     [facility] Adult PT Treatment/Exercise - 08/08/14 0802    Knee/Hip Exercises: Aerobic   Stationary Bike 5 min   Tread Mill light jog 10 min 4.0 mph   Knee/Hip Exercises: Plyometrics   Bilateral Jumping 20 reps;1 set   Box Circuit Box Height: 8";10 reps  ups   Box Circuit Limitations Jump downs 6 inch 10x   6 Meter Hop 1 set;20 reps  2 blocks distance on floor   Other Plyometric Exercises 90 degrees turn complex double limb jumps 12x on mini trampoline for comfort   Knee/Hip Exercises: Standing   SLS with Vectors SLS on left on green foam with 2# plyoball toss and SLS on BOSU with  toss 90 sec   Walking with Sports Cord green band around thighs with sidestepping 4x 25 feet; cowboys and tight rope walk   Cryotherapy   Number Minutes Cryotherapy --   Cryotherapy Location --   Type of Cryotherapy --                  PT Short Term Goals - 08/08/14 0832    PT SHORT TERM GOAL #1   Title Patient will express a good understanding of basic exercises to increase ROM, home methods for swelling and pain control   Status Achieved   PT SHORT TERM GOAL #2   Title Left knee AROM 4-135 degrees needed for greater ease on the stairs to enter/exit apartment.   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     Status Achieved   PT LONG TERM GOAL #4   Title FOTO functional outcome score improved 47% to 26% indicating improved function with less pain   Status Achieved               Plan - 08/08/14 0807    Clinical Impression Statement Patient has progressed very well with ROM, strength, proprioception of left knee.  She has participated in "limited return to activity phase" of rehab with simple double-limb and complex double limb jumps.  She notes mild discomfort only with pushing off and turning to the left and with landing with low level jumps.   Full AROM of the knee.  No reports of buckling.  Able to do all usual ADLS except playing basketball.  Will await MD appt to determine course of treatment.     PT Next Visit Plan See how MD appt went to determine if additional  rehab is necessary or if patient is a surgical candidate;  unless there a change in status should be ready for discharge since goals met        Problem List There are no active problems to display for this patient.   Lisa Roberson 08/08/2014, 8:51 AM  [facility] [address] [place], [place], [address] Phone: [phone]   Fax:  [phone]  Ruben Im, PT 08/08/2014 8:51 AM Phone: [phone] Fax: [phone]   PHYSICAL THERAPY DISCHARGE SUMMARY  Visits from Start of Care: 8  Current functional level related to goals / functional outcomes: All goals met with good return of ROM and strength.   No further treatment indicated after follow up with MD.     Remaining deficits: See above   Education / Equipment: HEP Plan: Patient agrees to discharge.  Patient goals were met. Patient is being discharged due to meeting the stated rehab goals.  ?????   Ruben Im, PT 09/27/2014 7:37 AM Phone: [phone] Fax: [phone]

## 2016-09-06 IMAGING — MR MR KNEE*L* W/O CM
4 of 5 series · 17 of 40 positions shown · non-contrast
Comparison: None.

CLINICAL DATA: Left knee pain, injured playing basketball

EXAM:
MRI OF THE LEFT KNEE WITHOUT CONTRAST
TECHNIQUE: Multiplanar, multisequence MR imaging of the knee was performed. No
intravenous contrast was administered.

[Series 4: T2 fat-sat · coronal · 3.5mm · 0.50mm/px · 3 of 25 slices shown]
[im 4/25]
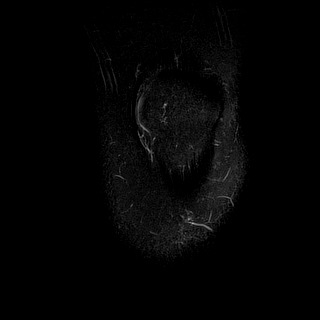
[im 14/25]
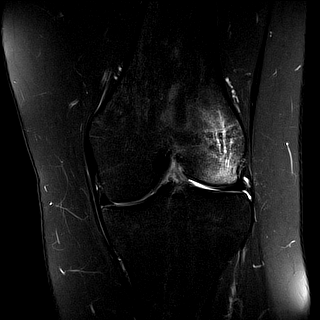
[im 21/25]
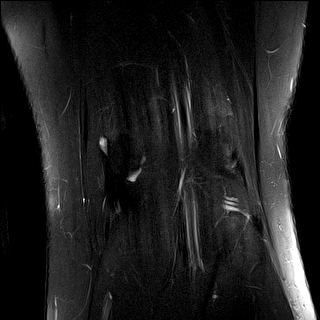

[Series 5: T1 · coronal · 3.5mm · 0.21mm/px · 3 of 25 slices shown]
[im 4/25]
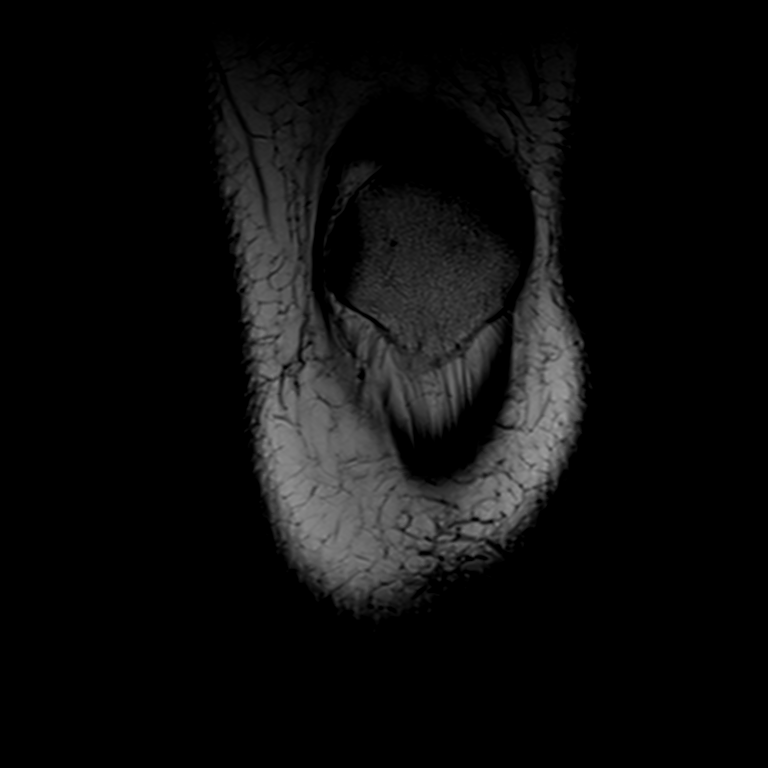
[im 14/25]
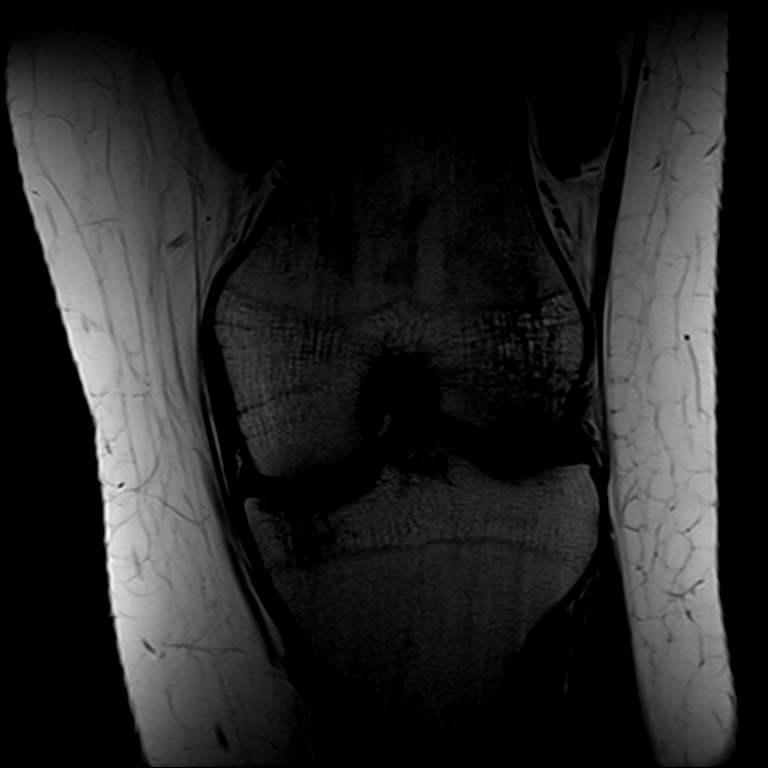
[im 21/25]
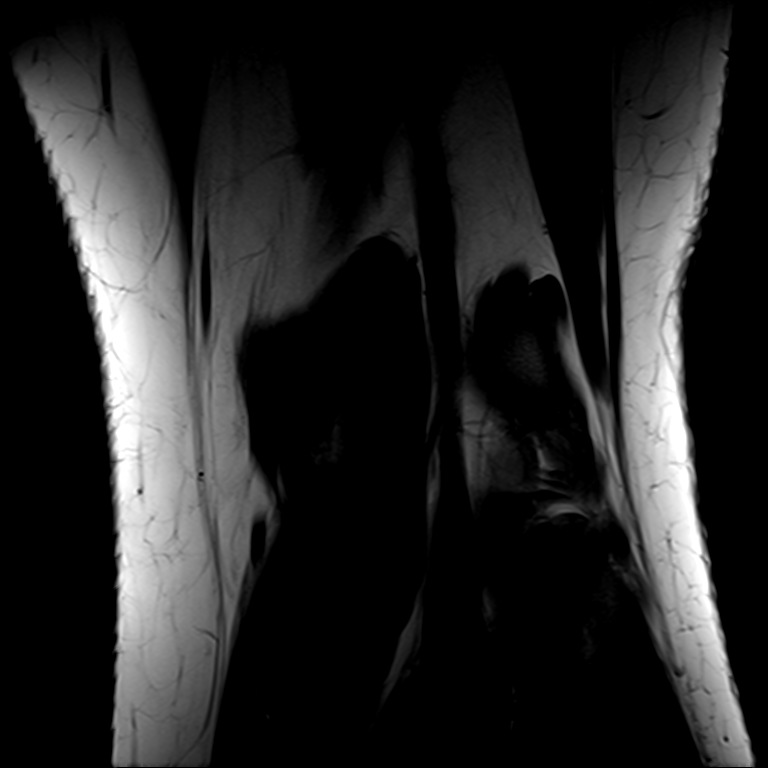

[Series 6: PD fat-sat · coronal · 3.5mm · 0.42mm/px · 8 of 25 slices shown (1 of 2)]
[im 1/25]
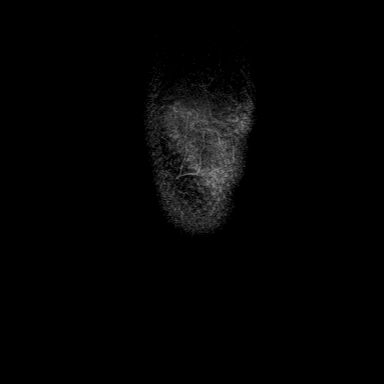
[im 4/25]
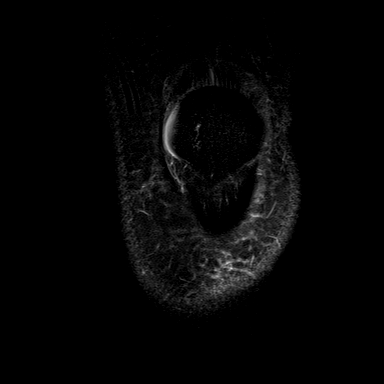
[im 7/25]
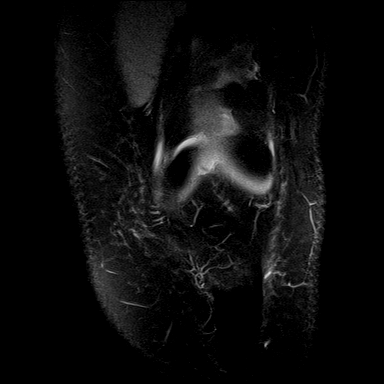
[im 11/25]
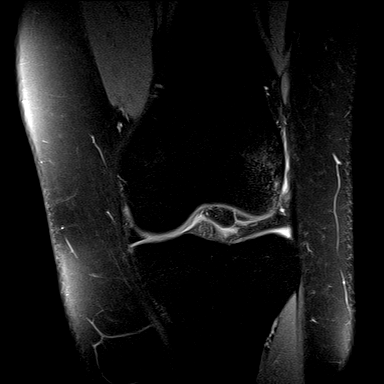
[im 14/25]
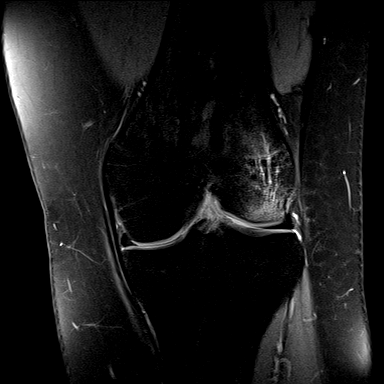
[im 18/25]
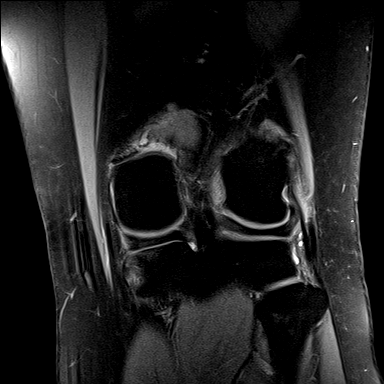
[im 21/25]
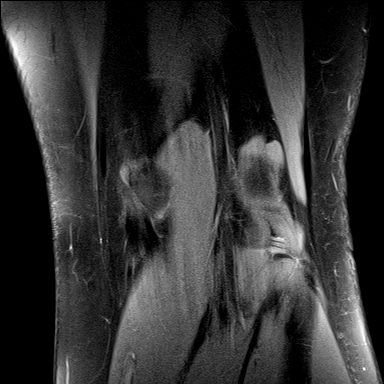
[im 25/25]
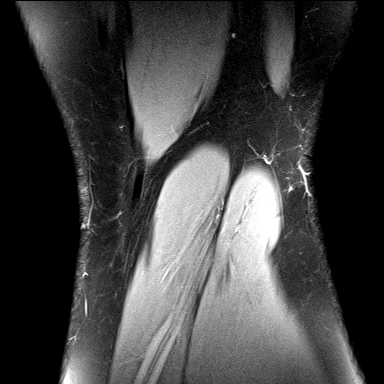

[Series 7: PD fat-sat · sagittal · 3.5mm · 0.21mm/px · 3 of 24 slices shown (2 of 2)]
[im 4/24]
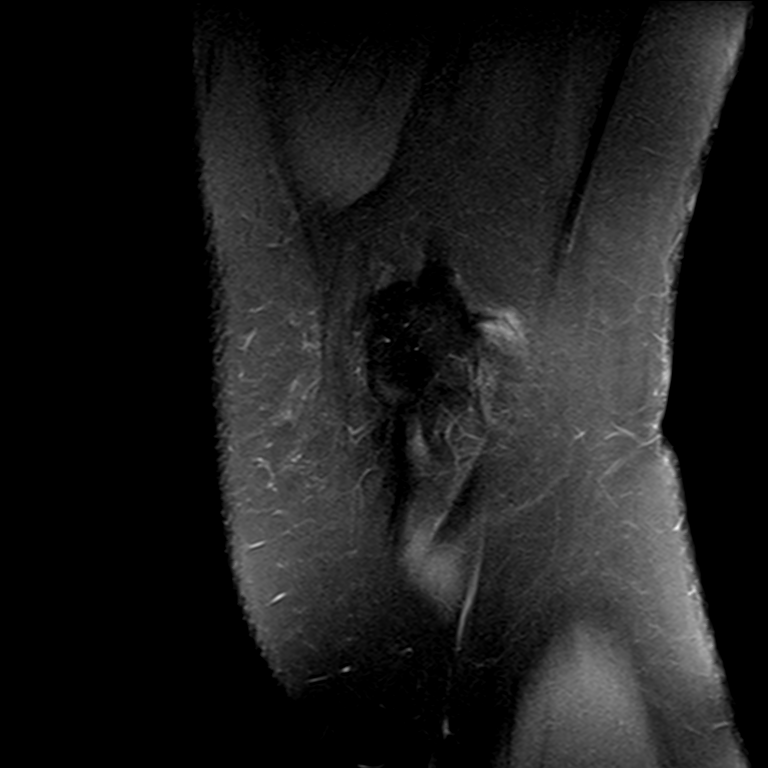
[im 14/24]
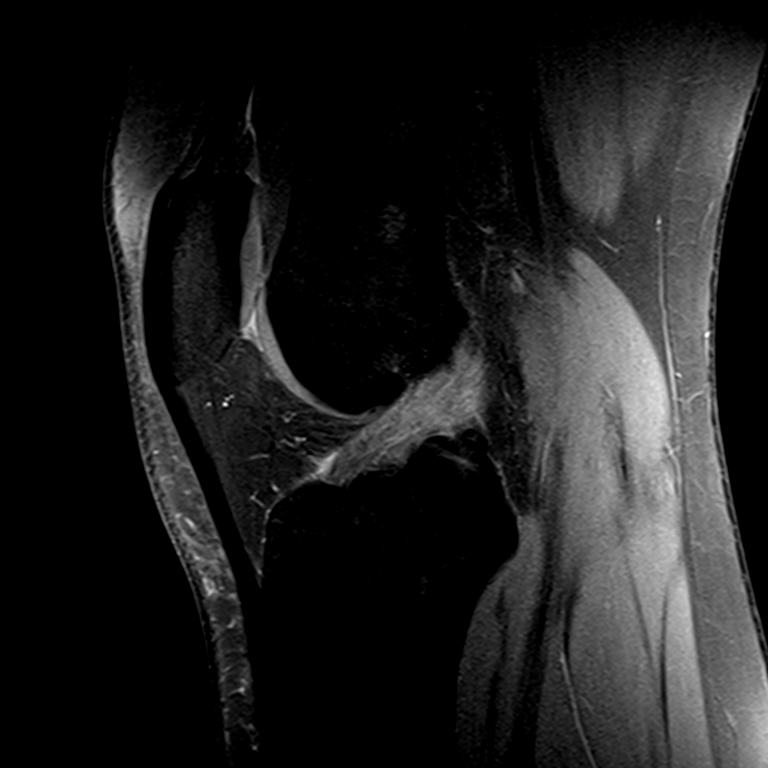
[im 20/24]
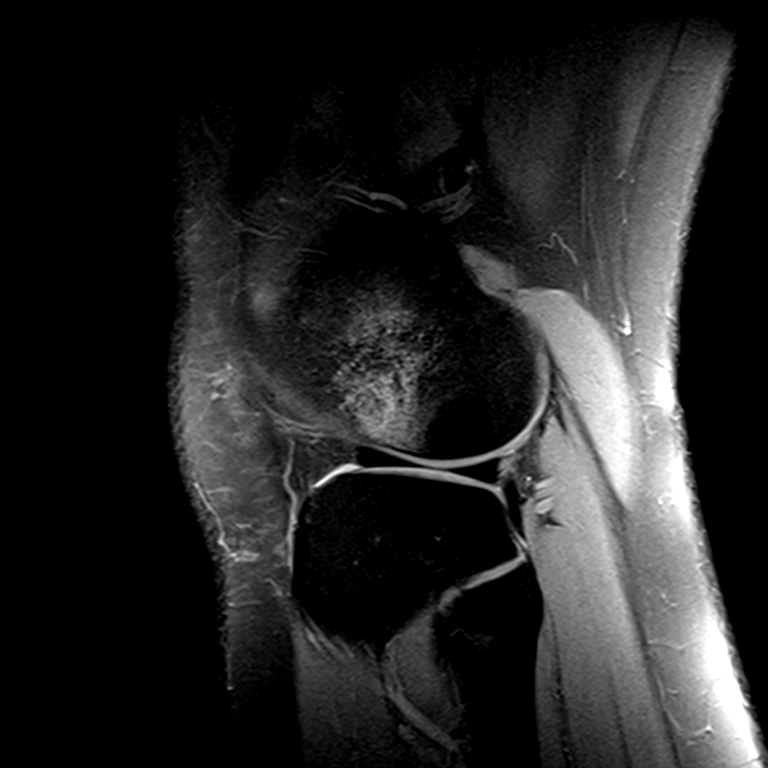

[17 of 40 positions shown; findings below may reference images not displayed]

FINDINGS: MENISCI

Medial meniscus: Small tear of the posterior horn of the medial
meniscus involving the inferior articular surface.

Lateral meniscus:  Intact.

LIGAMENTS

Cruciates: High-grade partial-thickness ACL tear with a few intact
fibers. Intact PCL.

Collaterals: Medial collateral ligament is intact. Lateral
collateral ligament complex is intact. Small amount of fluid signal
superficial to the fibular collateral ligament.

CARTILAGE

Patellofemoral:  No focal chondral defect.

Medial:  No focal chondral defect.

Lateral:  No focal chondral defect.

Joint: No joint effusion. Normal Hoffa's fat. No plical thickening.

Popliteal Fossa: Intact popliteus tendon. No Baker cyst. Small
ganglion cysts adjacent to the medial gastrocnemius origin.

Extensor Mechanism:  Intact.

Bones: Osseous contusion of the anterior lateral femoral condyle.
Mild osseous contusion of the posterior medial tibial plateau. No
fracture or dislocation.
IMPRESSION: 1. High-grade partial thickness ACL tear with a few intact fibers.
2. Small tear of the posterior horn of the medial meniscus involving
the inferior articular surface.
3. Osseous contusions of the anterolateral femoral condyle and to a
lesser degree posterior medial tibial plateau.

## 2018-01-30 ENCOUNTER — Other Ambulatory Visit: Payer: Self-pay

## 2018-01-30 ENCOUNTER — Encounter (HOSPITAL_COMMUNITY): Payer: Self-pay

## 2018-01-30 ENCOUNTER — Emergency Department (HOSPITAL_COMMUNITY)
Admission: EM | Admit: 2018-01-30 | Discharge: 2018-01-30 | Disposition: A | Discharge status: Home / Self Care | Payer: BLUE CROSS/BLUE SHIELD | Attending: Emergency Medicine | Admitting: Emergency Medicine

## 2018-01-30 DIAGNOSIS — R519 Headache, unspecified: Secondary | ICD-10-CM

## 2018-01-30 DIAGNOSIS — J45909 Unspecified asthma, uncomplicated: Secondary | ICD-10-CM | POA: Insufficient documentation

## 2018-01-30 DIAGNOSIS — Z79899 Other long term (current) drug therapy: Secondary | ICD-10-CM | POA: Insufficient documentation

## 2018-01-30 DIAGNOSIS — R51 Headache: Secondary | ICD-10-CM | POA: Insufficient documentation

## 2018-01-30 DIAGNOSIS — Y92410 Unspecified street and highway as the place of occurrence of the external cause: Secondary | ICD-10-CM | POA: Insufficient documentation

## 2018-01-30 DIAGNOSIS — M546 Pain in thoracic spine: Secondary | ICD-10-CM | POA: Insufficient documentation

## 2018-01-30 MED ORDER — IBUPROFEN 400 MG PO TABS
600.0000 mg | ORAL_TABLET | Freq: Once | ORAL | Status: AC
  Administered 2018-01-30: 600 mg via ORAL
  Filled 2018-01-30: qty 1

## 2018-01-30 NOTE — Discharge Instructions (Signed)
Take NSAIDs (Ibuprofen or Aleve) or Tylenol as needed for the next week. Take this medicine with food. Use a heating pad for sore muscles - use for 20 minutes several times a day Return for worsening symptoms
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
## 2019-09-17 ENCOUNTER — Emergency Department (HOSPITAL_COMMUNITY)
Admission: EM | Admit: 2019-09-17 | Discharge: 2019-09-17 | Disposition: A | Discharge status: Home / Self Care | Payer: BC Managed Care – PPO | Attending: Emergency Medicine | Admitting: Emergency Medicine

## 2019-09-17 ENCOUNTER — Emergency Department (HOSPITAL_COMMUNITY): Payer: BC Managed Care – PPO

## 2019-09-17 ENCOUNTER — Encounter (HOSPITAL_COMMUNITY): Payer: Self-pay | Admitting: Emergency Medicine

## 2019-09-17 ENCOUNTER — Other Ambulatory Visit: Payer: Self-pay

## 2019-09-17 DIAGNOSIS — Y999 Unspecified external cause status: Secondary | ICD-10-CM | POA: Diagnosis not present

## 2019-09-17 DIAGNOSIS — S9032XA Contusion of left foot, initial encounter: Secondary | ICD-10-CM | POA: Insufficient documentation

## 2019-09-17 DIAGNOSIS — Y9389 Activity, other specified: Secondary | ICD-10-CM | POA: Insufficient documentation

## 2019-09-17 DIAGNOSIS — J45909 Unspecified asthma, uncomplicated: Secondary | ICD-10-CM | POA: Insufficient documentation

## 2019-09-17 DIAGNOSIS — Y9289 Other specified places as the place of occurrence of the external cause: Secondary | ICD-10-CM | POA: Diagnosis not present

## 2019-09-17 DIAGNOSIS — S99822A Other specified injuries of left foot, initial encounter: Secondary | ICD-10-CM | POA: Diagnosis present

## 2019-09-17 DIAGNOSIS — R52 Pain, unspecified: Secondary | ICD-10-CM

## 2019-09-17 DIAGNOSIS — W228XXA Striking against or struck by other objects, initial encounter: Secondary | ICD-10-CM | POA: Diagnosis not present

## 2019-09-17 DIAGNOSIS — Z7951 Long term (current) use of inhaled steroids: Secondary | ICD-10-CM | POA: Insufficient documentation

## 2019-09-17 NOTE — Discharge Instructions (Addendum)
Wear the postop shoe as directed. Follow-up with your primary care provider. Take Tylenol and ibuprofen as needed to help with your pain and swelling. Return to the ER for worsening pain, swelling, additional injuries, numbness or weakness.

## 2019-09-17 NOTE — ED Triage Notes (Signed)
Patient arrives to ED with complaints of hitting her left foot on the side of the couch yesterday. Patient states that the foot is painful and swollen today.

## 2019-09-17 NOTE — ED Provider Notes (Signed)
[facility] EMERGENCY DEPARTMENT Provider Note   CSN: [id number] Arrival date & time: 09/17/19  6301     History Chief Complaint  Patient presents with   Foot Injury    Lisa Roberson is a 24 y.o. female who presents to ED with a chief complaint of left fourth digit pain.  States that she accidentally hit this toe on the side of the couch yesterday.  Reports worsening pain and swelling since then.  No prior fracture, dislocations or procedures in the area.  Has not tried medications to help with symptoms.  Denies any numbness, weakness, other injuries.  HPI     Past Medical History:  Diagnosis Date   Asthma     There are no problems to display for this patient.   History reviewed. No pertinent surgical history.   OB History   No obstetric history on file.     History reviewed. No pertinent family history.  Social History   Tobacco Use   Smoking status: Never Smoker   Smokeless tobacco: Never Used  Substance Use Topics   Alcohol use: No   Drug use: No    Home Medications Prior to Admission medications   Medication Sig Start Date End Date Taking? Authorizing Provider  albuterol (PROVENTIL HFA;VENTOLIN HFA) 108 (90 BASE) MCG/ACT inhaler Inhale 2 puffs into the lungs every 4 (four) hours as needed. Shortness of breath and wheezing 10/05/12   [provider]  HYDROcodone-acetaminophen (NORCO/VICODIN) 5-325 MG per tablet Take 1-2 tablets by mouth every 6 hours as needed for pain and/or cough. Patient not taking: Reported on 07/04/2014 05/29/14   Pisciotta, Joni Reining, PA-C    Allergies    Patient has no known allergies.  Review of Systems   Review of Systems  Constitutional: Negative for chills and fever.  Musculoskeletal: Positive for arthralgias.  Skin: Positive for color change. Negative for wound.  Neurological: Negative for weakness and numbness.    Physical Exam Updated Vital Signs BP 121/68    Pulse 73    Temp 98.5 F  (36.9 C) (Oral)    Resp 18    Ht 5\' 8"  (1.727 m)    Wt 74.8 kg    SpO2 100%    BMI 25.09 kg/m   Physical Exam Vitals and nursing note reviewed.  Constitutional:      General: She is not in acute distress.    Appearance: She is well-developed. She is not diaphoretic.  HENT:     Head: Normocephalic and atraumatic.  Eyes:     General: No scleral icterus.    Conjunctiva/sclera: Conjunctivae normal.  Pulmonary:     Effort: Pulmonary effort is normal. No respiratory distress.  Musculoskeletal:        General: Swelling and tenderness present.     Cervical back: Normal range of motion.     Comments: Tenderness to palpation of the left fourth digit with erythema noted.  Normal sensation to light touch.  Limited range of motion secondary to pain.  2+ DP pulses noted bilaterally.  Normal range of motion of the rest of digits, ankle bilaterally.  No deformities noted.  Skin:    Findings: No rash.  Neurological:     Mental Status: She is alert.     ED Results / Procedures / Treatments   Labs (all labs ordered are listed, but only abnormal results are displayed) Labs Reviewed - No data to display  EKG None  Radiology DG Foot Complete Left  Result Date: 09/17/2019 CLINICAL  DATA:  Hit foot on couch EXAM: LEFT FOOT - COMPLETE 3+ VIEW COMPARISON:  None. FINDINGS: No acute fracture or dislocation. Joint spaces and alignment are maintained. Subcortical cyst of the medial distal first metatarsal, likely degenerative changes. No unexpected radiopaque foreign body. Soft tissues are unremarkable. IMPRESSION: No acute fracture or dislocation. Electronically Signed   By: Meda Klinefelter MD   On: 09/17/2019 08:04    Procedures Procedures (including critical care time)  Medications Ordered in ED Medications - No data to display  ED Course  I have reviewed the triage vital signs and the nursing notes.  Pertinent labs & imaging results that were available during my care of the patient were  reviewed by me and considered in my medical decision making (see chart for details).    MDM Rules/Calculators/A&P                          24 year old female presenting to the ED for left fourth digit pain after injury yesterday.  Accidentally hit her foot on the side of a couch yesterday.  Reports pain and swelling today.  On exam there is some swelling and tenderness of the digit with limited range of motion secondary to pain.  She is able to perform range of motion.  Areas are vastly intact without open wounds.  Good distal pulses noted bilaterally.  X-ray shows no acute abnormalities.  Suspect that symptoms are due to a contusion.  Doubt infectious or vascular cause of symptoms.  We will have her wear postop shoe for comfort, NSAIDs, rice therapy and return precautions were given.  All imaging, if done today, including plain films, CT scans, and ultrasounds, independently reviewed by me, and interpretations confirmed via formal radiology reads.  Patient is hemodynamically stable, in NAD, and able to ambulate in the ED. Evaluation does not show pathology that would require ongoing emergent intervention or inpatient treatment. I explained the diagnosis to the patient. Pain has been managed and has no complaints prior to discharge. Patient is comfortable with above plan and is stable for discharge at this time. All questions were answered prior to disposition. Strict return precautions for returning to the ED were discussed. Encouraged follow up with PCP.   An After Visit Summary was printed and given to the patient.   Portions of this note were generated with Scientist, clinical (histocompatibility and immunogenetics). Dictation errors may occur despite best attempts at proofreading.  Final Clinical Impression(s) / ED Diagnoses Final diagnoses:  Pain  Contusion of left foot, initial encounter    Rx / DC Orders ED Discharge Orders    None       Dietrich Pates, PA-C 09/17/19 1227    Terald Sleeper, MD 09/17/19  [phone]

## 2019-11-02 ENCOUNTER — Other Ambulatory Visit: Payer: BC Managed Care – PPO
